# Patient Record
Sex: Male | Born: 1970
Health system: Southern US, Community
[De-identification: ages and names within clinical notes are randomized; demographics above are authoritative.]

## PROBLEM LIST (undated history)

## (undated) DIAGNOSIS — S069X9A Unspecified intracranial injury with loss of consciousness of unspecified duration, initial encounter: Secondary | ICD-10-CM

## (undated) DIAGNOSIS — S069XAA Unspecified intracranial injury with loss of consciousness status unknown, initial encounter: Secondary | ICD-10-CM

## (undated) DIAGNOSIS — R569 Unspecified convulsions: Secondary | ICD-10-CM

---

## 2018-02-20 ENCOUNTER — Emergency Department (HOSPITAL_COMMUNITY): Payer: Medicare HMO

## 2018-02-20 ENCOUNTER — Other Ambulatory Visit: Payer: Self-pay

## 2018-02-20 ENCOUNTER — Encounter (HOSPITAL_COMMUNITY): Payer: Self-pay | Admitting: *Deleted

## 2018-02-20 ENCOUNTER — Emergency Department (HOSPITAL_COMMUNITY)
Admission: EM | Admit: 2018-02-20 | Discharge: 2018-02-20 | Disposition: A | Discharge status: Home / Self Care | Payer: Medicare HMO | Attending: Emergency Medicine | Admitting: Emergency Medicine

## 2018-02-20 DIAGNOSIS — Y999 Unspecified external cause status: Secondary | ICD-10-CM | POA: Insufficient documentation

## 2018-02-20 DIAGNOSIS — M542 Cervicalgia: Secondary | ICD-10-CM | POA: Diagnosis not present

## 2018-02-20 DIAGNOSIS — S199XXA Unspecified injury of neck, initial encounter: Secondary | ICD-10-CM | POA: Insufficient documentation

## 2018-02-20 DIAGNOSIS — Y9389 Activity, other specified: Secondary | ICD-10-CM | POA: Insufficient documentation

## 2018-02-20 DIAGNOSIS — S0990XA Unspecified injury of head, initial encounter: Secondary | ICD-10-CM | POA: Diagnosis present

## 2018-02-20 DIAGNOSIS — R58 Hemorrhage, not elsewhere classified: Secondary | ICD-10-CM | POA: Diagnosis not present

## 2018-02-20 DIAGNOSIS — S91312A Laceration without foreign body, left foot, initial encounter: Secondary | ICD-10-CM | POA: Insufficient documentation

## 2018-02-20 DIAGNOSIS — S161XXA Strain of muscle, fascia and tendon at neck level, initial encounter: Secondary | ICD-10-CM

## 2018-02-20 DIAGNOSIS — M79672 Pain in left foot: Secondary | ICD-10-CM | POA: Diagnosis not present

## 2018-02-20 DIAGNOSIS — Z23 Encounter for immunization: Secondary | ICD-10-CM | POA: Diagnosis not present

## 2018-02-20 DIAGNOSIS — R51 Headache: Secondary | ICD-10-CM | POA: Diagnosis not present

## 2018-02-20 DIAGNOSIS — S99922A Unspecified injury of left foot, initial encounter: Secondary | ICD-10-CM | POA: Diagnosis not present

## 2018-02-20 DIAGNOSIS — Y92010 Kitchen of single-family (private) house as the place of occurrence of the external cause: Secondary | ICD-10-CM | POA: Insufficient documentation

## 2018-02-20 HISTORY — DX: Unspecified convulsions: R56.9

## 2018-02-20 HISTORY — DX: Unspecified intracranial injury with loss of consciousness status unknown, initial encounter: S06.9XAA

## 2018-02-20 HISTORY — DX: Unspecified intracranial injury with loss of consciousness of unspecified duration, initial encounter: S06.9X9A

## 2018-02-20 MED ORDER — TETANUS-DIPHTH-ACELL PERTUSSIS 5-2.5-18.5 LF-MCG/0.5 IM SUSP
0.5000 mL | Freq: Once | INTRAMUSCULAR | Status: AC
  Administered 2018-02-20: 0.5 mL via INTRAMUSCULAR
  Filled 2018-02-20: qty 0.5

## 2018-02-20 MED ORDER — LIDOCAINE HCL (PF) 1 % IJ SOLN
10.0000 mL | Freq: Once | INTRAMUSCULAR | Status: AC
  Administered 2018-02-20: 10 mL via INTRADERMAL
  Filled 2018-02-20: qty 30
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
## 2018-02-20 NOTE — Discharge Instructions (Signed)
Keep the wound clean and dry for the first 24 hours. After that you may gently clean the wound with soap and water. Make sure to pat dry the wound before covering it with any dressing. You can use topical antibiotic ointment and bandage. Ice and elevate for pain relief.   You can take Tylenol or Ibuprofen as directed for pain. You can alternate Tylenol and Ibuprofen every 4 hours for additional pain relief.   Return to the Emergency Department, your primary care doctor, or the [facility] in 5-7 days for suture removal.   Monitor closely for any signs of infection. Return to the Emergency Department for any worsening redness/swelling of the area that begins to spread, drainage from the site, worsening pain, fever or any other worsening or concerning symptoms.   For neck pain take Tylenol or ibuprofen.  Follow-up with your primary care doctor.  If you do not have primary care doctor, you can follow-up with [facility].  Return to emergency department for any numbness/weakness of your arms or legs, difficulty walking, loss of control of your bowels or bladder, any other worsening or concerning symptoms.
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
## 2019-06-18 IMAGING — CT CT CERVICAL SPINE W/O CM
3 of 7 series · 13 of 33 positions shown, 15 images · non-contrast
Comparison: None.

CLINICAL DATA: Headache and neck pain. History of traumatic brain
injury

EXAM:
CT HEAD WITHOUT CONTRAST
CT CERVICAL SPINE WITHOUT CONTRAST
TECHNIQUE: Multidetector CT imaging of the head and cervical spine was
performed following the standard protocol without intravenous
contrast. Multiplanar CT image reconstructions of the cervical spine
were also generated.

[Series 6: coronal soft tissue · coronal · 0.30mm/px · 3 of 73 slices shown]
[im 19/73  bone]
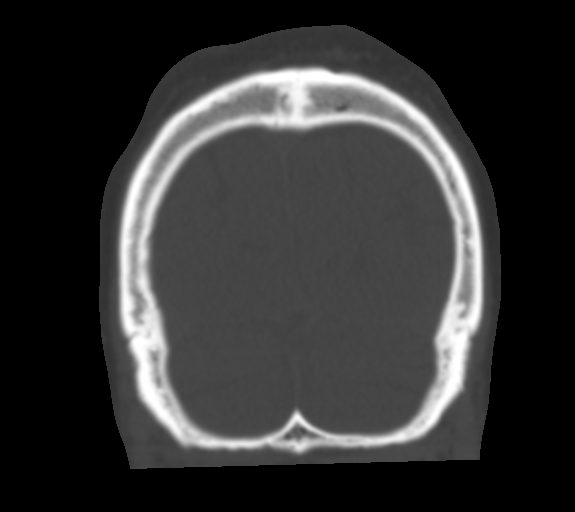
[im 37/73  bone]
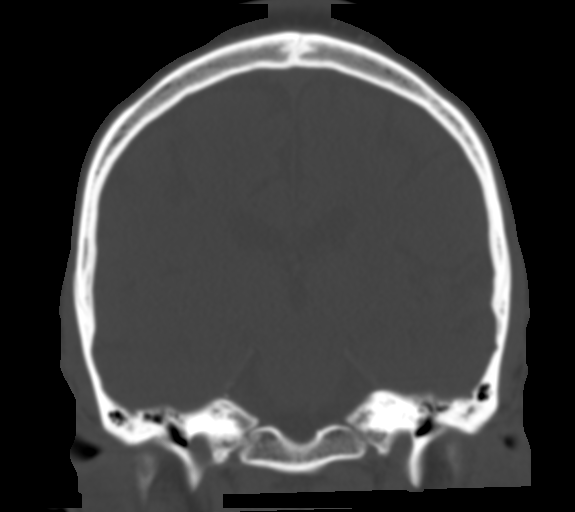
[im 55/73  bone]
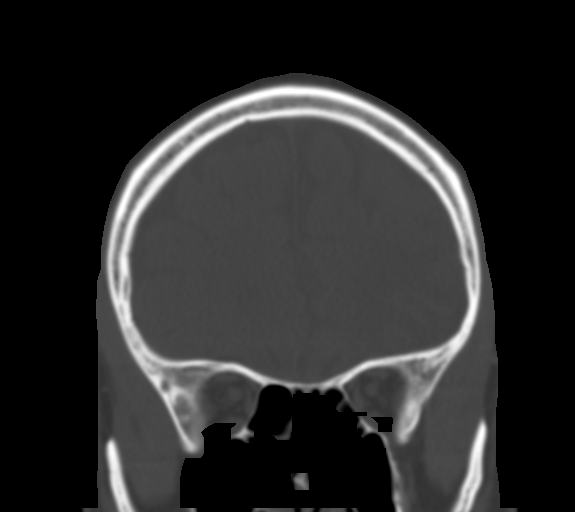

[Series 11: orthogonal bone · axial · 0.23mm/px · z∈[-318,-173]mm · 5 of 125 slices shown, 7 images]
[im 21/125  soft-tissue]
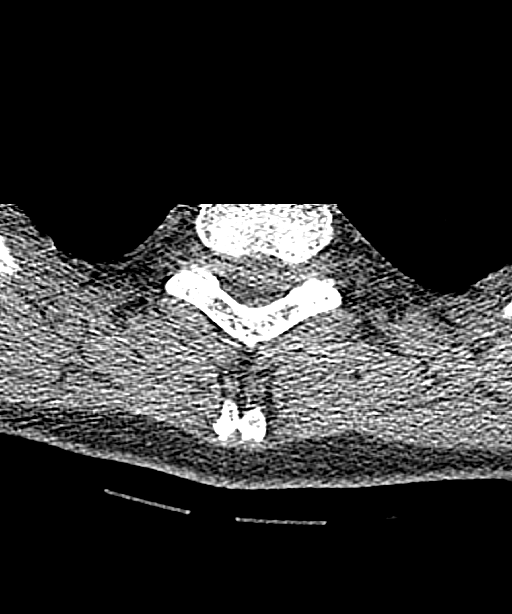
[im 21/125  bone]
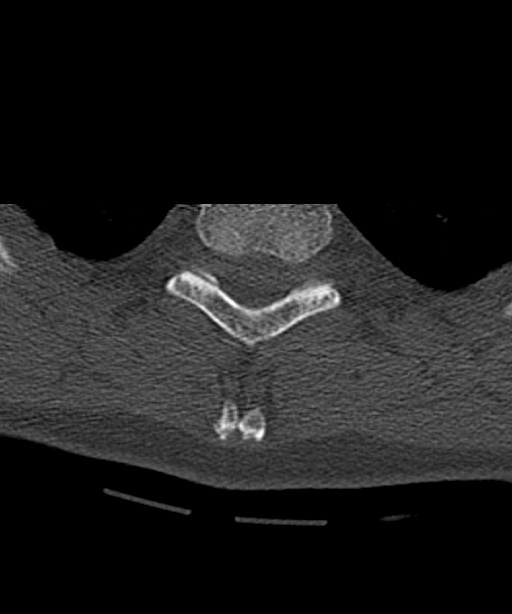
[im 42/125  bone]
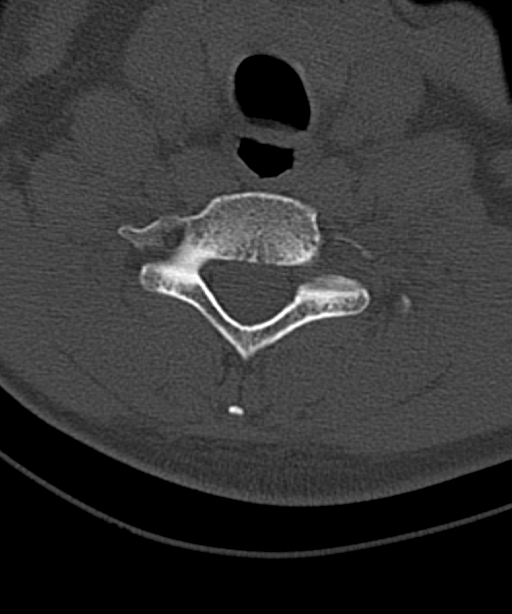
[im 63/125  bone]
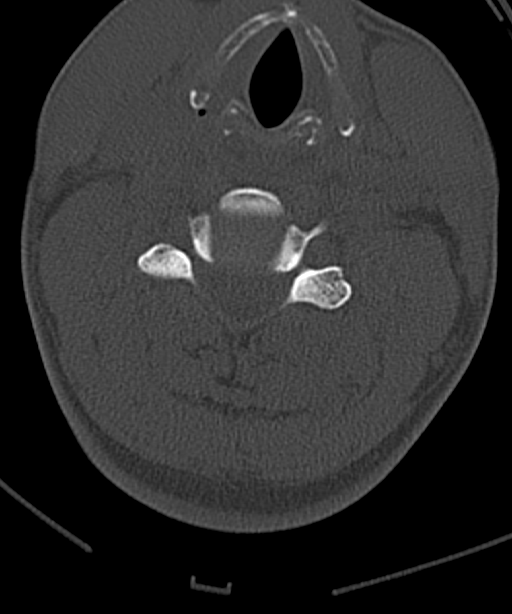
[im 83/125  bone]
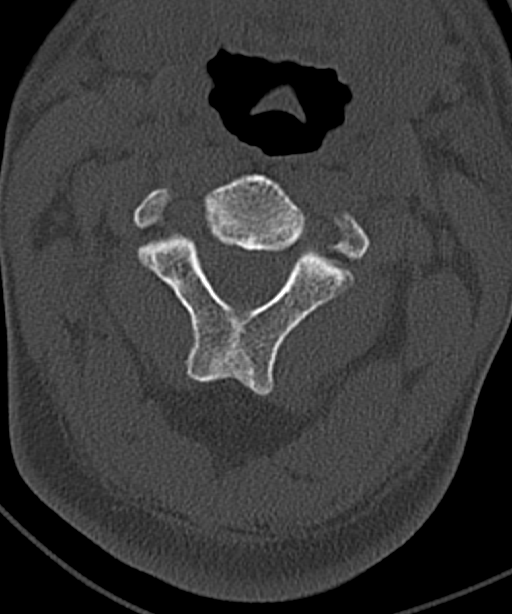
[im 104/125  soft-tissue]
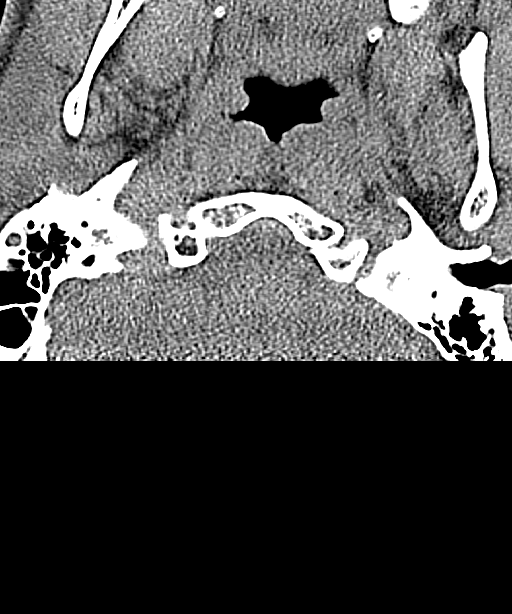
[im 104/125  bone]
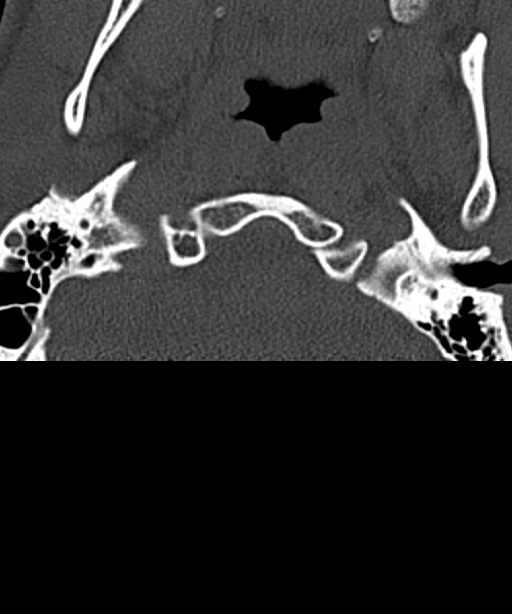

[Series 13: sagittal bone · sagittal · 0.29mm/px · 5 of 61 slices shown]
[im 11/61  bone]
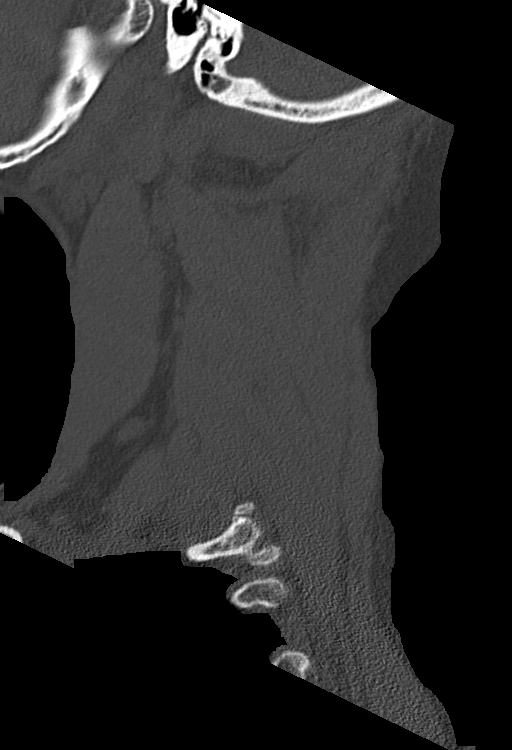
[im 21/61  bone]
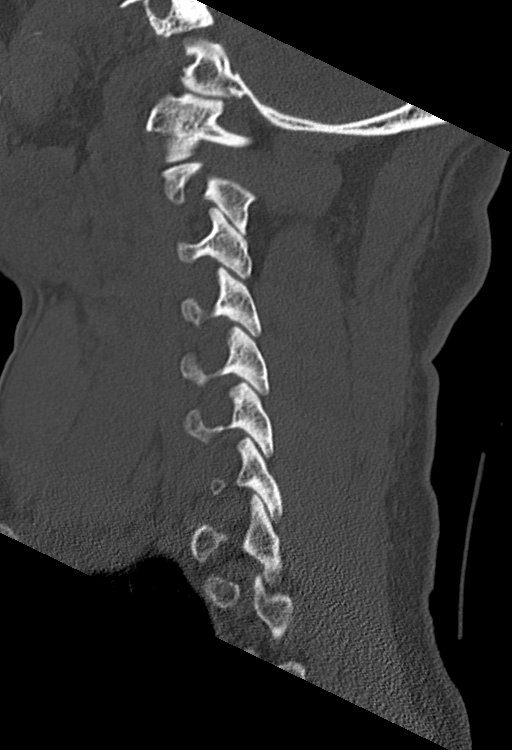
[im 31/61  bone]
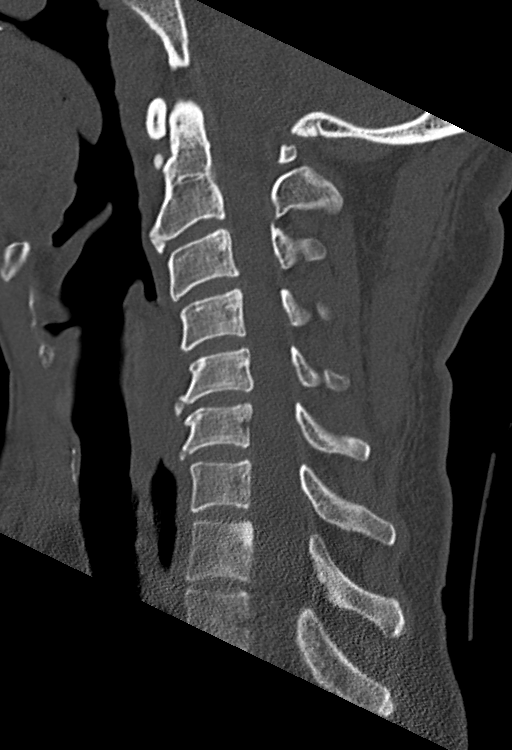
[im 41/61  bone]
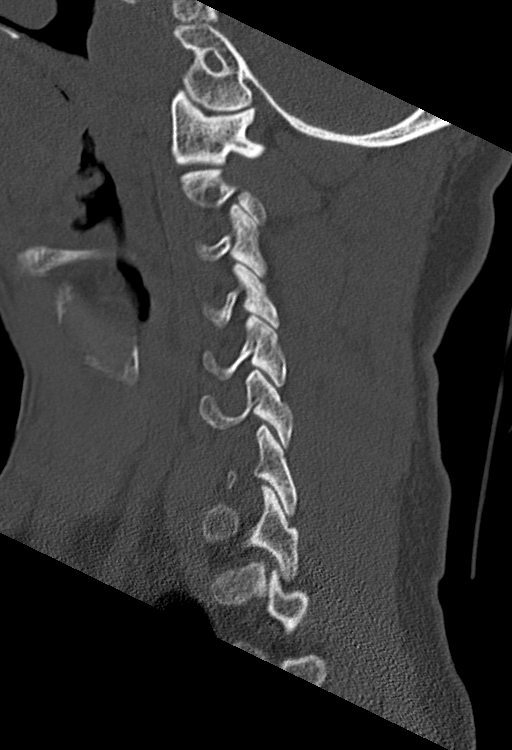
[im 51/61  bone]
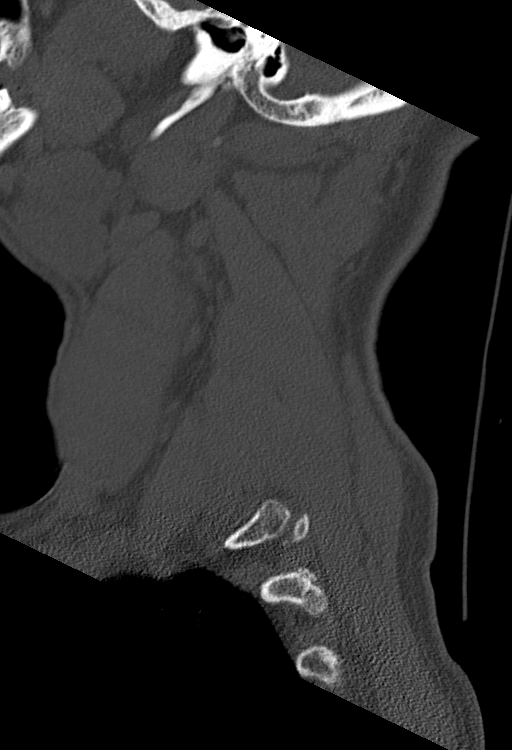

[13 of 33 positions shown; findings below may reference images not displayed]

FINDINGS: CT HEAD FINDINGS

Brain: Chronic volume loss left frontal lobe consistent with history
traumatic brain injury. Small extra-axial fluid collection in the
left middle cranial fossa may also be due to prior trauma.

Mild generalized atrophy. Negative for hydrocephalus. No acute
infarct, hemorrhage, mass

Vascular: Negative for hyperdense vessel

Skull: Negative

Sinuses/Orbits: Negative

Other: None

CT CERVICAL SPINE FINDINGS

Alignment: Normal

Skull base and vertebrae: Negative for acute fracture. Chronic
fracture right first rib. Possible chronic fracture left second rib.

Soft tissues and spinal canal: Negative

Disc levels: Mild disc degeneration and spurring on the left at
C3-4. No significant disc space narrowing in the cervical spine

Upper chest: Negative

Other: None
IMPRESSION: 1. No acute intracranial abnormality. Atrophy and encephalomalacia
left frontal lobe compatible with chronic traumatic brain injury.
2. Negative for acute cervical spine fracture. Chronic fracture
right first rib.
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
